# Patient Record
Sex: Female | Born: 1960 | Race: White | Hispanic: No | Marital: Married | Smoking: Never smoker
Health system: Southern US, Community
[De-identification: ages and names within clinical notes are randomized; demographics above are authoritative.]

## PROBLEM LIST (undated history)

## (undated) DIAGNOSIS — I10 Essential (primary) hypertension: Secondary | ICD-10-CM

## (undated) HISTORY — PX: COLONOSCOPY: SHX5424

## (undated) HISTORY — PX: HERNIA REPAIR: SHX51

## (undated) HISTORY — PX: APPENDECTOMY: SHX54

---
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
## 1984-06-29 HISTORY — PX: NASAL SEPTUM SURGERY: SHX37

## 2014-06-12 ENCOUNTER — Emergency Department: Payer: Self-pay | Admitting: Emergency Medicine

## 2014-06-12 LAB — CBC WITH DIFFERENTIAL/PLATELET
BASOS ABS: 0 10*3/uL (ref 0.0–0.1)
BASOS PCT: 0.3 %
Eosinophil #: 0.1 10*3/uL (ref 0.0–0.7)
Eosinophil %: 1.5 %
HCT: 45.2 % (ref 35.0–47.0)
HGB: 14.8 g/dL (ref 12.0–16.0)
Lymphocyte #: 2.2 10*3/uL (ref 1.0–3.6)
Lymphocyte %: 26.8 %
MCH: 30.8 pg (ref 26.0–34.0)
MCHC: 32.9 g/dL (ref 32.0–36.0)
MCV: 94 fL (ref 80–100)
Monocyte #: 0.6 x10 3/mm (ref 0.2–0.9)
Monocyte %: 7 %
Neutrophil #: 5.3 10*3/uL (ref 1.4–6.5)
Neutrophil %: 64.4 %
Platelet: 280 10*3/uL (ref 150–440)
RBC: 4.82 10*6/uL (ref 3.80–5.20)
RDW: 12.7 % (ref 11.5–14.5)
WBC: 8.2 10*3/uL (ref 3.6–11.0)

## 2014-06-12 LAB — URINALYSIS, COMPLETE
BILIRUBIN, UR: NEGATIVE
BLOOD: NEGATIVE
Bacteria: NONE SEEN
GLUCOSE, UR: NEGATIVE mg/dL (ref 0–75)
Ketone: NEGATIVE
LEUKOCYTE ESTERASE: NEGATIVE
Nitrite: NEGATIVE
PH: 6 (ref 4.5–8.0)
Protein: NEGATIVE
RBC,UR: <1 /HPF (ref 0–5)
Specific Gravity: 1.008 (ref 1.003–1.030)
Squamous Epithelial: 1

## 2014-06-12 LAB — COMPREHENSIVE METABOLIC PANEL
ALK PHOS: 86 U/L
Albumin: 4 g/dL (ref 3.4–5.0)
Anion Gap: 6 — ABNORMAL LOW (ref 7–16)
BUN: 7 mg/dL (ref 7–18)
Bilirubin,Total: 0.3 mg/dL (ref 0.2–1.0)
CREATININE: 0.75 mg/dL (ref 0.60–1.30)
Calcium, Total: 9.1 mg/dL (ref 8.5–10.1)
Chloride: 106 mmol/L (ref 98–107)
Co2: 25 mmol/L (ref 21–32)
EGFR (African American): >60
EGFR (Non-African Amer.): >60
GLUCOSE: 89 mg/dL (ref 65–99)
Osmolality: 271 (ref 275–301)
POTASSIUM: 3.8 mmol/L (ref 3.5–5.1)
SGOT(AST): 23 U/L (ref 15–37)
SGPT (ALT): 32 U/L
Sodium: 137 mmol/L (ref 136–145)
TOTAL PROTEIN: 7.8 g/dL (ref 6.4–8.2)

## 2014-08-01 ENCOUNTER — Ambulatory Visit: Payer: Self-pay | Admitting: Internal Medicine

## 2014-08-24 DIAGNOSIS — R1031 Right lower quadrant pain: Secondary | ICD-10-CM | POA: Insufficient documentation

## 2014-09-17 ENCOUNTER — Ambulatory Visit: Payer: Self-pay | Admitting: Gastroenterology

## 2014-09-24 ENCOUNTER — Ambulatory Visit: Payer: Self-pay | Admitting: Gastroenterology

## 2014-09-24 LAB — HCG, QUANTITATIVE, PREGNANCY: Beta Hcg, Quant.: <1 m[IU]/mL

## 2014-10-22 LAB — SURGICAL PATHOLOGY

## 2014-12-27 IMAGING — CT CT ABD-PELV W/ CM
2 of 5 series · 15 of 46 positions shown, 17 images · IV contrast (omnipaque)
Comparison: None.

CLINICAL DATA: Chronic lower abdominal pain, primarily on the
right.

EXAM:
CT ABDOMEN AND PELVIS WITH CONTRAST
TECHNIQUE: Multidetector CT imaging of the abdomen and pelvis was performed
using the standard protocol following bolus administration of
intravenous contrast. Oral contrast was also administered.
CONTRAST:  100 mL Omnipaque 300 nonionic

[Series 2: routine abd pel with · axial · 0.71mm/px · z∈[-331,+94]mm · 12 of 96 slices shown, 14 images]
[im 6/96  soft-tissue]
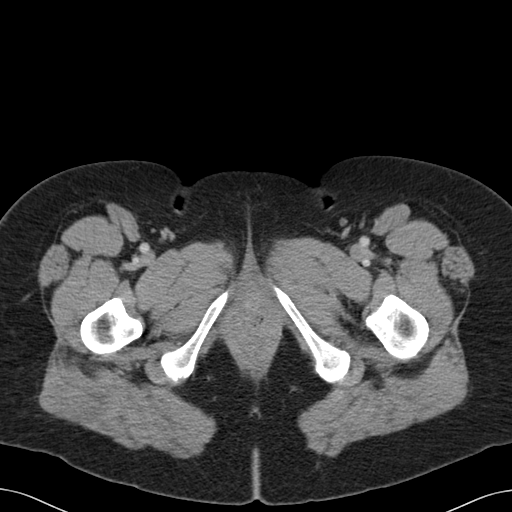
[im 6/96  bone]
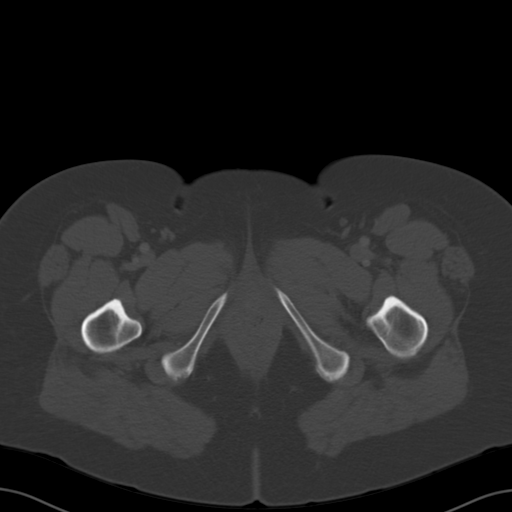
[im 16/96  soft-tissue]
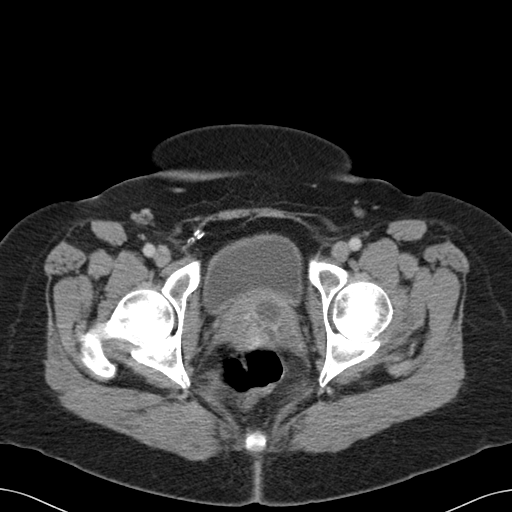
[im 21/96  soft-tissue]
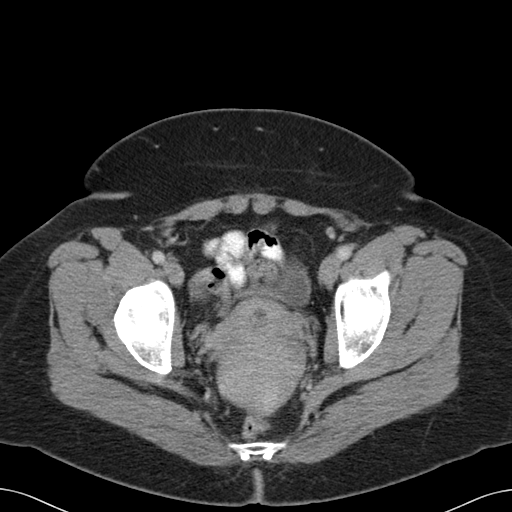
[im 31/96  soft-tissue]
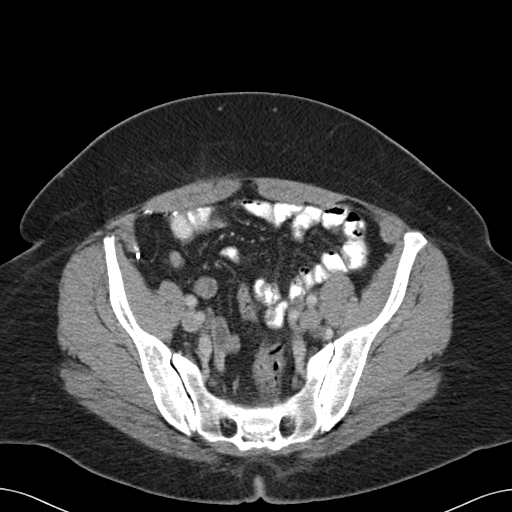
[im 36/96  soft-tissue]
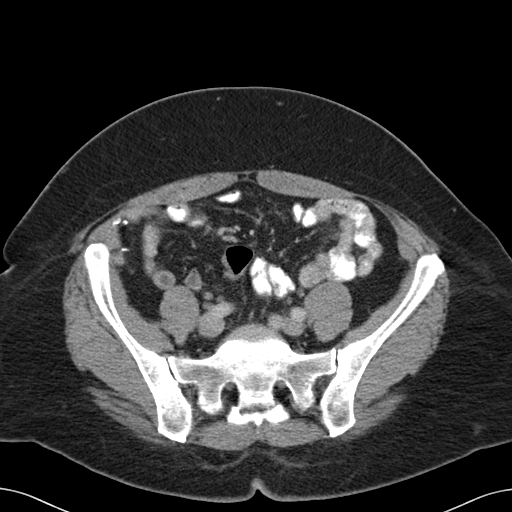
[im 46/96  soft-tissue]
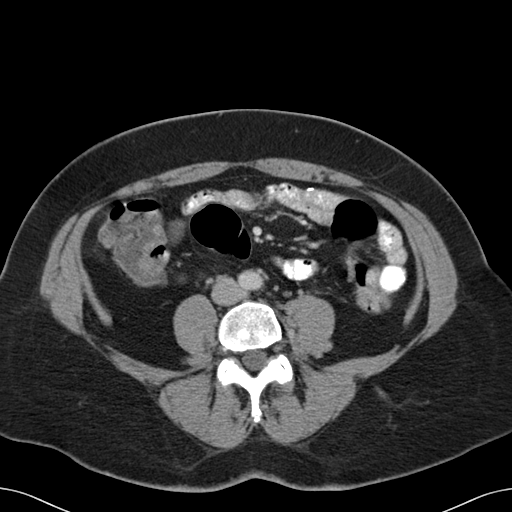
[im 51/96  soft-tissue]
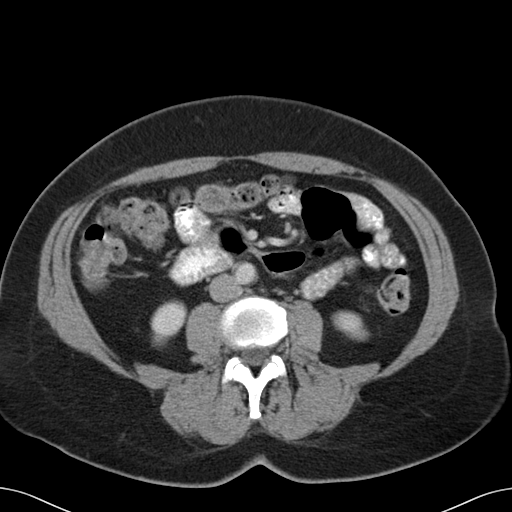
[im 61/96  soft-tissue]
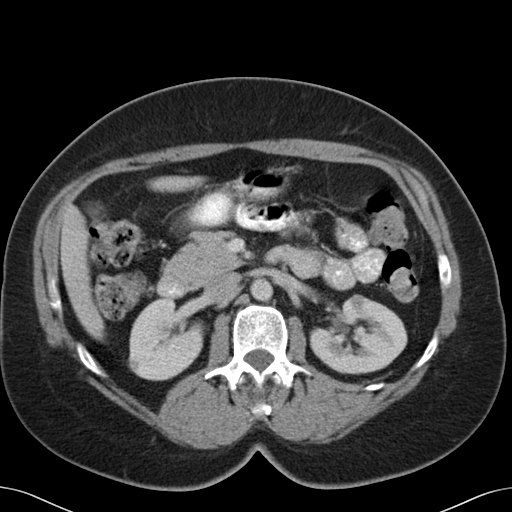
[im 66/96  soft-tissue]
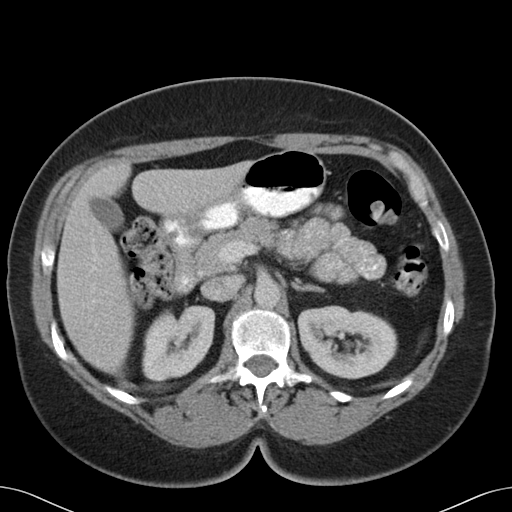
[im 66/96  bone]
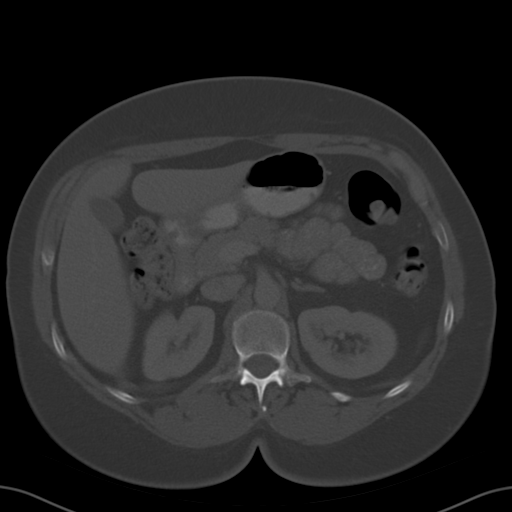
[im 76/96  soft-tissue]
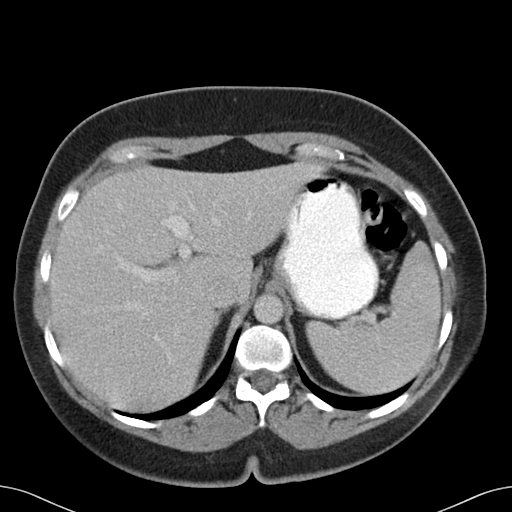
[im 81/96  soft-tissue]
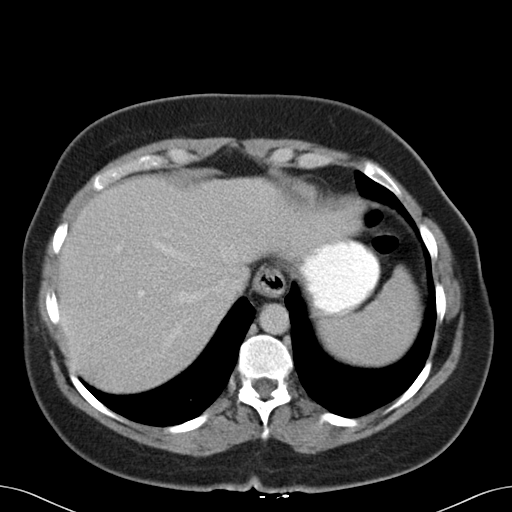
[im 91/96  soft-tissue]
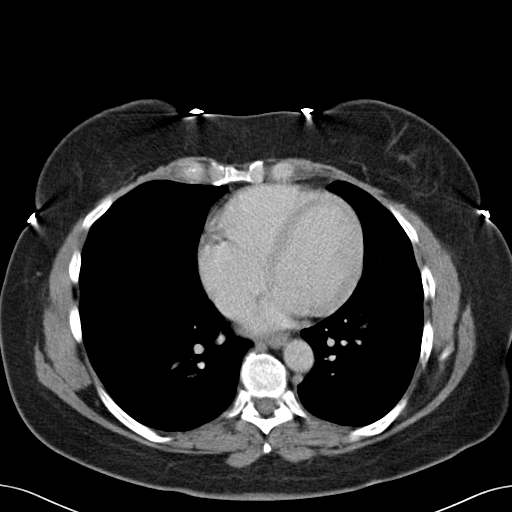

[Series 6: cor routine abd pel with · coronal · 0.69mm/px · 3 of 149 slices shown]
[im 50/149  soft-tissue]
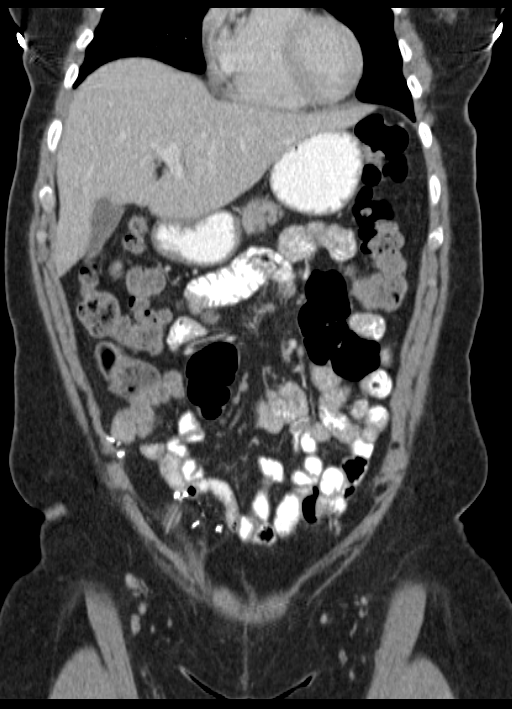
[im 66/149  soft-tissue]
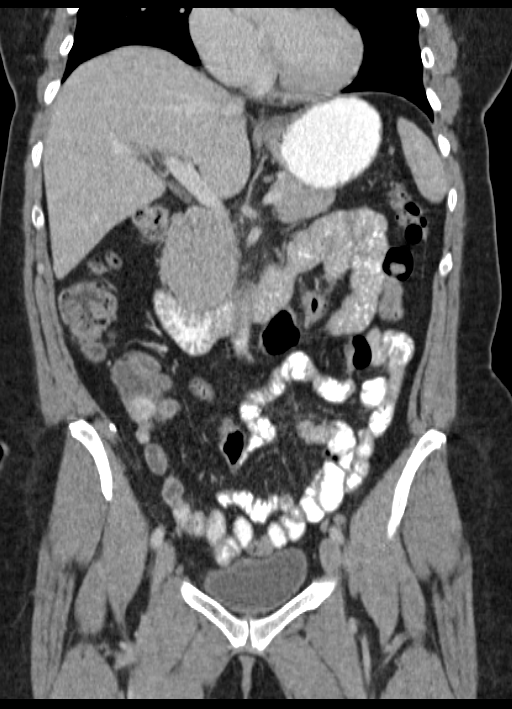
[im 83/149  soft-tissue]
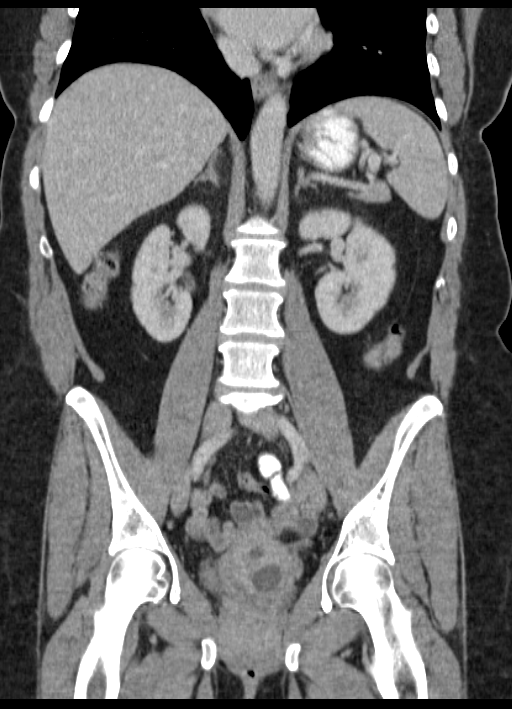

[15 of 46 positions shown; findings below may reference images not displayed]

FINDINGS: Lung bases are clear.  There is a small hiatal hernia.

No focal liver lesions are identified. The gallbladder wall is not
thickened. There is no biliary duct dilatation.

Spleen, pancreas, and adrenals appear normal.

There is a mass arising from the lateral right kidney measuring
by 1.0 cm which has attenuation values higher than is expected with
a simple cyst. There is a mass along the anterior mid left kidney
which also has attenuation values higher than is expected with a
simple cyst measuring 1.0 by 0.9 cm. No other renal masses are
appreciable. There is no hydronephrosis on either side. There is no
renal or ureteral calculus on either side.

In the pelvis, the urinary bladder is midline with normal wall
thickness. There is a prominent cervical nabothian cyst measuring
2.2 x 1.6 cm. A second nabothian cyst measures 1 x 1 cm. The
endometrium measures 2.0 cm in thickness which is rather prominent
for age. Apart from the uterus and cervix, no mass lesions are
identified. There is no pelvic fluid.

There are surgical clips in the lateral right lower abdomen.
Appendix is not seen. There is no periappendiceal region
inflammation.

There is fairly diffuse stool throughout the colon.

There is no bowel obstruction.  No free air or portal venous air.

There is no ascites, adenopathy, or abscess in the abdomen or
pelvis. There is no demonstrable abdominal aortic aneurysm. There is
degenerative change in the lumbar spine. There are no blastic or
lytic bone lesions.
IMPRESSION: Question a degree of constipation. No bowel obstruction. No abscess
or mesenteric thickening.

Prominence of the endometrium. This finding may warrant pelvic
ultrasound to further evaluate. There are cervical nabothian cysts.

There is a small mass lesion in each kidney which cannot be
classified as a cyst. Further evaluation nonemergently warranted.
Further evaluation with pre and post contrast MRI should be
considered. Pre and post contrast CT could alternatively be
performed, but would likely be of decreased accuracy given lesion
size.

Small hiatal hernia.

No renal or ureteral calculus. No hydronephrosis. No periappendiceal
region inflammation.

## 2015-03-14 ENCOUNTER — Other Ambulatory Visit: Payer: Self-pay | Admitting: Internal Medicine

## 2015-03-14 DIAGNOSIS — D259 Leiomyoma of uterus, unspecified: Secondary | ICD-10-CM

## 2015-03-18 ENCOUNTER — Ambulatory Visit: Payer: Commercial Indemnity

## 2015-05-17 ENCOUNTER — Ambulatory Visit
Admission: RE | Admit: 2015-05-17 | Discharge: 2015-05-17 | Disposition: A | Discharge status: Home / Self Care | Payer: Managed Care, Other (non HMO) | Source: Ambulatory Visit | Attending: Internal Medicine | Admitting: Internal Medicine

## 2015-05-17 DIAGNOSIS — D259 Leiomyoma of uterus, unspecified: Secondary | ICD-10-CM | POA: Insufficient documentation

## 2015-05-17 DIAGNOSIS — N83202 Unspecified ovarian cyst, left side: Secondary | ICD-10-CM | POA: Insufficient documentation

## 2015-06-09 ENCOUNTER — Emergency Department
Admission: EM | Admit: 2015-06-09 | Discharge: 2015-06-09 | Disposition: A | Discharge status: Home / Self Care | Payer: Managed Care, Other (non HMO) | Attending: Emergency Medicine | Admitting: Emergency Medicine

## 2015-06-09 ENCOUNTER — Encounter: Payer: Self-pay | Admitting: Emergency Medicine

## 2015-06-09 DIAGNOSIS — R1031 Right lower quadrant pain: Secondary | ICD-10-CM | POA: Insufficient documentation

## 2015-06-09 DIAGNOSIS — G8929 Other chronic pain: Secondary | ICD-10-CM | POA: Insufficient documentation

## 2015-06-09 DIAGNOSIS — I10 Essential (primary) hypertension: Secondary | ICD-10-CM | POA: Insufficient documentation

## 2015-06-09 HISTORY — DX: Essential (primary) hypertension: I10

## 2015-06-09 LAB — CBC WITH DIFFERENTIAL/PLATELET
Basophils Absolute: 0 10*3/uL (ref 0–0.1)
Basophils Relative: 1 %
EOS PCT: 0 %
Eosinophils Absolute: 0 10*3/uL (ref 0–0.7)
HEMATOCRIT: 40.6 % (ref 35.0–47.0)
Hemoglobin: 13.8 g/dL (ref 12.0–16.0)
LYMPHS ABS: 2.2 10*3/uL (ref 1.0–3.6)
LYMPHS PCT: 26 %
MCH: 30.8 pg (ref 26.0–34.0)
MCHC: 33.9 g/dL (ref 32.0–36.0)
MCV: 90.8 fL (ref 80.0–100.0)
MONO ABS: 0.6 10*3/uL (ref 0.2–0.9)
MONOS PCT: 7 %
NEUTROS ABS: 5.7 10*3/uL (ref 1.4–6.5)
Neutrophils Relative %: 66 %
PLATELETS: 268 10*3/uL (ref 150–440)
RBC: 4.47 MIL/uL (ref 3.80–5.20)
RDW: 13.1 % (ref 11.5–14.5)
WBC: 8.6 10*3/uL (ref 3.6–11.0)

## 2015-06-09 LAB — COMPREHENSIVE METABOLIC PANEL
ALT: 29 U/L (ref 14–54)
AST: 18 U/L (ref 15–41)
Albumin: 4.3 g/dL (ref 3.5–5.0)
Alkaline Phosphatase: 95 U/L (ref 38–126)
Anion gap: 7 (ref 5–15)
BILIRUBIN TOTAL: 0.5 mg/dL (ref 0.3–1.2)
BUN: 13 mg/dL (ref 6–20)
CHLORIDE: 107 mmol/L (ref 101–111)
CO2: 24 mmol/L (ref 22–32)
CREATININE: 0.61 mg/dL (ref 0.44–1.00)
Calcium: 9.3 mg/dL (ref 8.9–10.3)
Glucose, Bld: 103 mg/dL — ABNORMAL HIGH (ref 65–99)
POTASSIUM: 4.2 mmol/L (ref 3.5–5.1)
Sodium: 138 mmol/L (ref 135–145)
TOTAL PROTEIN: 7.9 g/dL (ref 6.5–8.1)

## 2015-06-09 LAB — LIPASE, BLOOD: Lipase: 33 U/L (ref 11–51)

## 2015-06-09 MED ORDER — OXYCODONE-ACETAMINOPHEN 5-325 MG PO TABS: 1.0000 | ORAL_TABLET | ORAL | Status: AC | PRN

## 2015-06-09 NOTE — ED Provider Notes (Signed)
[facility] Emergency Department Provider Note  ____________________________________________  Time seen: Approximately 3:33 PM  I have reviewed the triage vital signs and the nursing notes.   HISTORY  Chief Complaint Abdominal Pain    HPI Heidi Mcclain is a 54 y.o. female with a history of chronic right lower quadrant pain, status post remote right hernia mesh placement 3, uterine fibroids, status post appendectomy presenting with worsening right lower quadrant pain. Patient states that she has had daily right lower quadrant pain for years. She has been treated with gabapentin. In September October, she had a colonoscopy where 3 polyps were removed. Since then, she states that her pain has been getting progressively worse. She reports a "searing" pain that is not associated with any nausea, vomiting, diarrhea, urinary symptoms, or vaginal discharge. She is not sexually active. Last week, she had a pelvic ultrasound which showed uterine fibroids but normal ovaries; she has a follow-up appointment with her gynecologist this week. She is not been having any fevers or chills. She is requesting surgical follow-up "to go in there and see what is going on" as she does not have a general surgeon in the area.She is concerned that she may have a mesh that has been recalled. She also wanted to make sure that she is not "septic" today.   Past Medical History  Diagnosis Date  . Hypertension     There are no active problems to display for this patient.   Past Surgical History  Procedure Laterality Date  . Appendectomy    . Hernia repair      Current Outpatient Rx  Name  Route  Sig  Dispense  Refill  . oxyCODONE-acetaminophen (ROXICET) 5-325 MG tablet   Oral   Take 1 tablet by mouth every 4 (four) hours as needed.   20 tablet   0     Allergies Ciprofloxacin  No family history on file.  Social History Social History  Substance Use Topics  . Smoking status:  Never Smoker   . Smokeless tobacco: None  . Alcohol Use: Yes    Review of Systems Constitutional: No fever/chills. No lightheadedness or syncope. No trauma. Eyes: No visual changes. ENT: No sore throat. Cardiovascular: Denies chest pain, palpitations. Respiratory: Denies shortness of breath.  No cough. Gastrointestinal: Positive right lower quadrant abdominal pain. No flank pain. No nausea, no vomiting.  No diarrhea.  No constipation. No blood in the stool. Genitourinary: Negative for dysuria. No urinary frequency. No change in vaginal discharge. Musculoskeletal: Negative for back pain. Skin: Negative for rash. Neurological: Negative for headaches, focal weakness or numbness.  10-point ROS otherwise negative.  ____________________________________________   PHYSICAL EXAM:  VITAL SIGNS: ED Triage Vitals  Enc Vitals Group     BP 06/09/15 1453 149/79 mmHg     Pulse Rate 06/09/15 1453 68     Resp 06/09/15 1453 18     Temp 06/09/15 1453 97.9 F (36.6 C)     Temp Source 06/09/15 1453 Oral     SpO2 06/09/15 1453 99 %     Weight 06/09/15 1453 202 lb (91.627 kg)     Height 06/09/15 1453 5\' 7"  (1.702 m)     Head Cir --      Peak Flow --      Pain Score 06/09/15 1454 7     Pain Loc --      Pain Edu? --      Excl. in [place]? --     Constitutional: Alert  and oriented. Well appearing and in no acute distress. Answer question appropriately. Eyes: Conjunctivae are normal.  EOMI. Head: Atraumatic. Nose: No congestion/rhinnorhea. Mouth/Throat: Mucous membranes are moist.  Neck: No stridor.  Supple.   Cardiovascular: Normal rate, regular rhythm. No murmurs, rubs or gallops.  Respiratory: Normal respiratory effort.  No retractions. Lungs CTAB.  No wheezes, rales or ronchi. Gastrointestinal: Abdomen is overweight, soft, nondistended. She has a linear healed surgical scar in the right lower quadrant with pain laterally and distally to the surgical incision area. There is no palpable hernia.  No mass. No peritoneal signs, guarding or rebound. Patient has a negative Murphy sign. Musculoskeletal: No LE edema.  Neurologic:  Normal speech and language. No gross focal neurologic deficits are appreciated.  Skin:  Skin is warm, dry and intact. No rash noted. Psychiatric: Mood and affect are normal. Speech and behavior are normal.  Normal judgement  ____________________________________________   LABS (all labs ordered are listed, but only abnormal results are displayed)  Labs Reviewed  COMPREHENSIVE METABOLIC PANEL - Abnormal; Notable for the following:    Glucose, Bld 103 (*)    All other components within normal limits  CBC WITH DIFFERENTIAL/PLATELET  URINALYSIS COMPLETEWITH MICROSCOPIC ([facility] ONLY)  LIPASE, BLOOD   ____________________________________________  EKG  Not indicated ____________________________________________  RADIOLOGY  No results found.  ____________________________________________   PROCEDURES  Procedure(s) performed: No  Critical Care performed: No ____________________________________________   INITIAL IMPRESSION / ASSESSMENT AND PLAN / ED COURSE  Pertinent labs & imaging results that were available during my care of the patient were reviewed by me and considered in my medical decision making (see chart for details).  54 y.o. female with a history of chronic right lower abdominal pain with worsening pain over the last several weeks. The patient has no other systemic symptoms. Her vital signs are reassuring and her blood work is normal. She is not having any dysuria or vaginal discharge. I think it is likely that she is continuing to have chronic pain. I do not see any evidence of incarcerated hernia, or other acute surgical pathology today. I will give her the referral to the general surgeon on call, and have prescribed narcotics for short-term use. I think she will benefit from keeping her gynecologic appointment, seeing the general surgeons,  and if the workup is negative from there, consider making a long-term pain management plan with her from a care physician, Dr. Chancy Milroy. Patient be discharge and understands return precautions as well as follow-up instructions.  ____________________________________________  FINAL CLINICAL IMPRESSION(S) / ED DIAGNOSES  Final diagnoses:  Chronic right lower quadrant pain      NEW MEDICATIONS STARTED DURING THIS VISIT:  New Prescriptions   OXYCODONE-ACETAMINOPHEN (ROXICET) 5-325 MG TABLET    Take 1 tablet by mouth every 4 (four) hours as needed.     Eula Listen, MD 06/09/15 1539

## 2015-06-09 NOTE — Discharge Instructions (Signed)
Please keep your appointment with the gynecologist this week. Please make an appointment with the general surgeon for evaluation. Please bring all your previous surgical records with you to your appointments.  Please return to the emergency department if you develop severe pain, fever, inability to keep down fluids, or any other symptoms concerning to you.

## 2015-06-09 NOTE — ED Notes (Signed)
C/o RLQ abd. That she has had since her colonoscopy in September, states she has a "hernia patch" in the same area that she has pain, states pain has gotten progressively worse, denies any n,v

## 2015-06-13 ENCOUNTER — Other Ambulatory Visit: Payer: Self-pay

## 2015-06-19 ENCOUNTER — Ambulatory Visit: Payer: Self-pay | Admitting: General Surgery

## 2015-06-20 ENCOUNTER — Ambulatory Visit (INDEPENDENT_AMBULATORY_CARE_PROVIDER_SITE_OTHER): Payer: Managed Care, Other (non HMO) | Admitting: General Surgery

## 2015-06-20 ENCOUNTER — Encounter: Payer: Self-pay | Admitting: General Surgery

## 2015-06-20 VITALS — BP 130/80 | HR 76 | Resp 12 | Ht 67.0 in | Wt 211.0 lb

## 2015-06-20 DIAGNOSIS — R1030 Lower abdominal pain, unspecified: Secondary | ICD-10-CM

## 2015-06-20 DIAGNOSIS — R1031 Right lower quadrant pain: Secondary | ICD-10-CM

## 2015-06-20 NOTE — Progress Notes (Addendum)
Patient ID: Heidi Mcclain, female   DOB: February 01, 1961, 54 y.o.   MRN: [id number]  Chief Complaint  Patient presents with  . Other    right groin pain    HPI Heidi HEMBERGER is a 54 y.o. female here today for a evaluation of right groin pain. She states pain in the area has been going on for 14 years now. In the last year she states the area is burning more and painful to do particular activities area walking is not troublesome, nor is hiking but using a stair master produces pain. At times she finds it difficult to lay flat in bed at night. The patient reports a popping sensation in the right groin that is intermittent and unrelated to activity. The patient first began to have diffuse abdominal pain in 1998-1999. She described undergoing a diagnostic laparoscopy and then what sounds to be a laparotomy. She subsequently underwent laparoscopic hernia repair. The pain had settled into the right groin by 2000-2001 at the time of the hernia repair. She was more aware of a reproducible mass in the groin prior to the hernia repair. She's had pain in the groin since the procedure, although fairly minimal until year ago.   No records of her treatment in [place], was constant were available for review.   I personally reviewed the patient's history. HPI  Past Medical History  Diagnosis Date  . Hypertension     Past Surgical History  Procedure Laterality Date  . Tonsillectomy  1965  . Colonoscopy  [id number]    Ronnette Hila, M  . Nasal septum surgery  1986  . Hernia repair      right inguinal  . Hernia repair      umbilical   . Appendectomy      age 24    Family History  Problem Relation Age of Onset  . Breast cancer Paternal Grandmother     Social History Social History  Substance Use Topics  . Smoking status: Never Smoker   . Smokeless tobacco: None  . Alcohol Use: Yes    Allergies  Allergen Reactions  . Ciprofloxacin Hives    Current Outpatient Prescriptions  Medication  Sig Dispense Refill  . amLODipine-benazepril (LOTREL) 5-10 MG capsule Take 1 capsule by mouth daily.  1  . gabapentin (NEURONTIN) 100 MG capsule Take 2 capsules by mouth 2 (two) times daily.  2  . ketoconazole (NIZORAL) 2 % shampoo Apply 1 application topically daily.  4  . omeprazole (PRILOSEC) 40 MG capsule Take 1 capsule by mouth daily.  1  . oxyCODONE-acetaminophen (ROXICET) 5-325 MG tablet Take 1 tablet by mouth every 4 (four) hours as needed. 20 tablet 0  . PARoxetine (PAXIL) 10 MG tablet Take 1 tablet by mouth daily.  6   No current facility-administered medications for this visit.    Review of Systems Review of Systems  Constitutional: Negative.   HENT: Negative.   Respiratory: Negative.   Cardiovascular: Negative.   Endocrine: Negative.   Genitourinary: Negative.   Allergic/Immunologic: Negative.   Hematological: Negative.   Psychiatric/Behavioral: Negative.     Blood pressure 130/80, pulse 76, resp. rate 12, height 5\' 7"  (1.702 m), weight 211 lb (95.709 kg), last menstrual period 05/12/2015.  Physical Exam Physical Exam  Constitutional: She is oriented to person, place, and time. She appears well-developed and well-nourished.  HENT:  Head: Normocephalic.  Eyes: Conjunctivae are normal. Pupils are equal, round, and reactive to light. No scleral icterus.  Neck: Neck supple. No  thyromegaly present.  Cardiovascular: Normal rate, regular rhythm and normal heart sounds.   Pulmonary/Chest: Effort normal and breath sounds normal.  Abdominal: Soft. Normal appearance and bowel sounds are normal. There is no hepatomegaly. There is no tenderness.    Musculoskeletal: Normal range of motion.  Lymphadenopathy:    She has no cervical adenopathy.  Neurological: She is alert and oriented to person, place, and time.  Skin: Skin is warm and dry.  Psychiatric: She has a normal mood and affect. Her behavior is normal. Judgment and thought content normal.    Data Reviewed Office  notes from Bunnie Pion, MD dated 06/13/2015 were reviewed. No GYN source identified.  CT scan of the abdomen and pelvis completed on 06/12/2014 through the emergency department for right lower quadrant pain showed questionable renal masses that were less than 1.2 cm in diameter. A vertical cyst was identified measuring 2.2 cm. Thickened endometrium a 2.0 cm. No mass lesions are identified. Surgical clips are identified in the right lateral lower abdomen. Appendix appears absent. No evidence of inflammation in the right lower quadrant. Questionable degree of constipation. Small bowel follow-through for right lower quadrant pain ordered by Loistine Simas, M.D. 09/24/2014 was normal.  Colonoscopy dated 09/17/2014 for right lower quadrant pain showed polyps in the cecum and sigmoid colon. Pathology showed a sessile serrated adenoma without dysplasia. Random biopsies of the right colon were negative as well as left colon. Hyperplastic polyps in the sigmoid.  Impression  Intermittent right lower quadrant pain, likely musculoskeletal source. Not clearly related to previous transfixion device is used at the time of the 2001 hernia repair.   Plan    The patient brought up the possibility of removing the tacks and placing a new mesh. Would not for the tacks being evident there is no inflammation in the right lower quadrant to suggest an inflammatory process involving the mesh. The possibility of removing all of the tacks is low, and the likelihood of this leaving her pain is small.  She had resumed gabapentin recently as this had been beneficial year ago. She's been making use of it for several weeks now without any improvement.  The patient may benefit from a management assessment for nerve block/TENS unit/biofeedback. No indication for surgical intervention at this time. The vast majority of the 45 minute appointment was spent reviewing records and CT scans other imaging studies and reviewing options  for management.  I will be glad to review the records from [place] if they're made available.      PCP:  Perrin Maltese  Ref. Dr. Vernie Ammons  This information has been scribed by Gaspar Cola CMA.    Robert Bellow 06/21/2015, 11:40 AM

## 2015-06-20 NOTE — Patient Instructions (Signed)
Patient to return as needed. 

## 2015-06-21 DIAGNOSIS — R1031 Right lower quadrant pain: Secondary | ICD-10-CM | POA: Insufficient documentation

## 2015-10-28 ENCOUNTER — Other Ambulatory Visit: Payer: Self-pay | Admitting: Internal Medicine

## 2015-10-28 DIAGNOSIS — Z1231 Encounter for screening mammogram for malignant neoplasm of breast: Secondary | ICD-10-CM

## 2015-11-26 ENCOUNTER — Ambulatory Visit: Payer: Managed Care, Other (non HMO)

## 2015-12-05 ENCOUNTER — Ambulatory Visit: Payer: Managed Care, Other (non HMO)

## 2015-12-17 ENCOUNTER — Other Ambulatory Visit: Payer: Self-pay | Admitting: Internal Medicine

## 2015-12-17 DIAGNOSIS — R9389 Abnormal findings on diagnostic imaging of other specified body structures: Secondary | ICD-10-CM

## 2015-12-19 ENCOUNTER — Other Ambulatory Visit: Payer: Self-pay | Admitting: Internal Medicine

## 2015-12-19 ENCOUNTER — Ambulatory Visit
Admission: RE | Admit: 2015-12-19 | Discharge: 2015-12-19 | Disposition: A | Discharge status: Home / Self Care | Payer: Managed Care, Other (non HMO) | Source: Ambulatory Visit | Attending: Internal Medicine | Admitting: Internal Medicine

## 2015-12-19 DIAGNOSIS — Z1231 Encounter for screening mammogram for malignant neoplasm of breast: Secondary | ICD-10-CM | POA: Insufficient documentation

## 2015-12-19 DIAGNOSIS — M25511 Pain in right shoulder: Secondary | ICD-10-CM

## 2015-12-26 ENCOUNTER — Ambulatory Visit
Admission: RE | Admit: 2015-12-26 | Discharge: 2015-12-26 | Disposition: A | Discharge status: Home / Self Care | Payer: Managed Care, Other (non HMO) | Source: Ambulatory Visit | Attending: Internal Medicine | Admitting: Internal Medicine

## 2015-12-26 DIAGNOSIS — N83201 Unspecified ovarian cyst, right side: Secondary | ICD-10-CM | POA: Insufficient documentation

## 2015-12-26 DIAGNOSIS — N888 Other specified noninflammatory disorders of cervix uteri: Secondary | ICD-10-CM | POA: Insufficient documentation

## 2015-12-26 DIAGNOSIS — R9389 Abnormal findings on diagnostic imaging of other specified body structures: Secondary | ICD-10-CM

## 2016-01-06 ENCOUNTER — Ambulatory Visit
Admission: RE | Admit: 2016-01-06 | Discharge: 2016-01-06 | Disposition: A | Discharge status: Home / Self Care | Payer: Managed Care, Other (non HMO) | Source: Ambulatory Visit | Attending: Internal Medicine | Admitting: Internal Medicine

## 2016-01-06 ENCOUNTER — Other Ambulatory Visit: Payer: Self-pay | Admitting: Internal Medicine

## 2016-01-06 DIAGNOSIS — N6001 Solitary cyst of right breast: Secondary | ICD-10-CM | POA: Insufficient documentation

## 2016-01-06 DIAGNOSIS — N6310 Unspecified lump in the right breast, unspecified quadrant: Secondary | ICD-10-CM

## 2016-01-06 DIAGNOSIS — N63 Unspecified lump in breast: Secondary | ICD-10-CM | POA: Diagnosis present

## 2016-01-08 ENCOUNTER — Ambulatory Visit: Payer: Managed Care, Other (non HMO)

## 2016-01-14 ENCOUNTER — Ambulatory Visit: Admission: RE | Admit: 2016-01-14 | Payer: Managed Care, Other (non HMO) | Source: Ambulatory Visit

## 2016-01-14 ENCOUNTER — Other Ambulatory Visit: Payer: Managed Care, Other (non HMO)

## 2017-01-08 IMAGING — US US PELVIS COMPLETE
1 series · 14 of 25 positions shown · non-contrast
Comparison: CT abdomen and pelvis 06/12/2014.

CLINICAL DATA: Right lower quadrant pain. Vaginal bleeding since
February 2015.



[Series 1: us pelvis complete · 0.31mm/px · 14 of 112 slices shown]
[im 1/112]
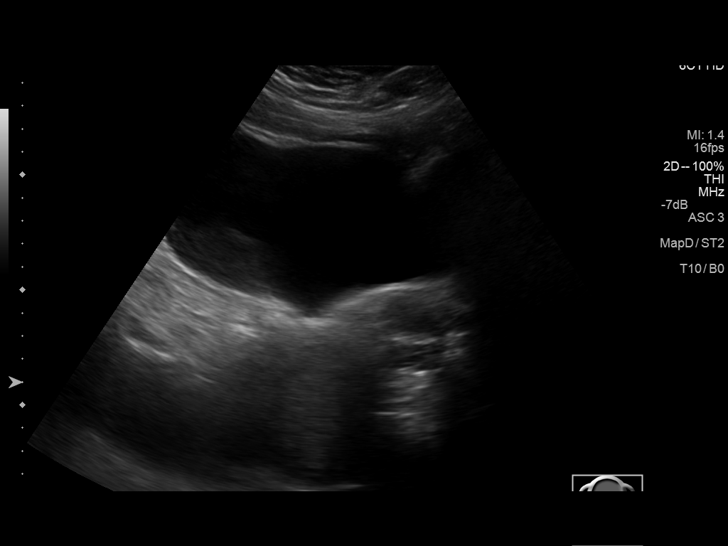
[im 10/112]
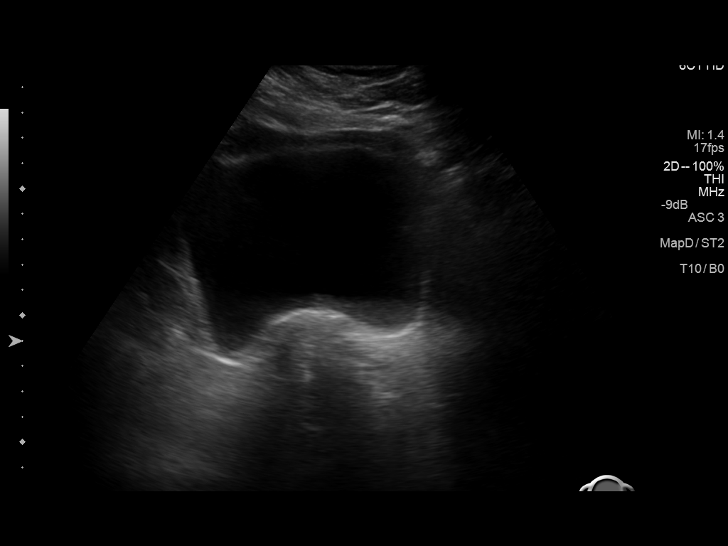
[im 19/112]
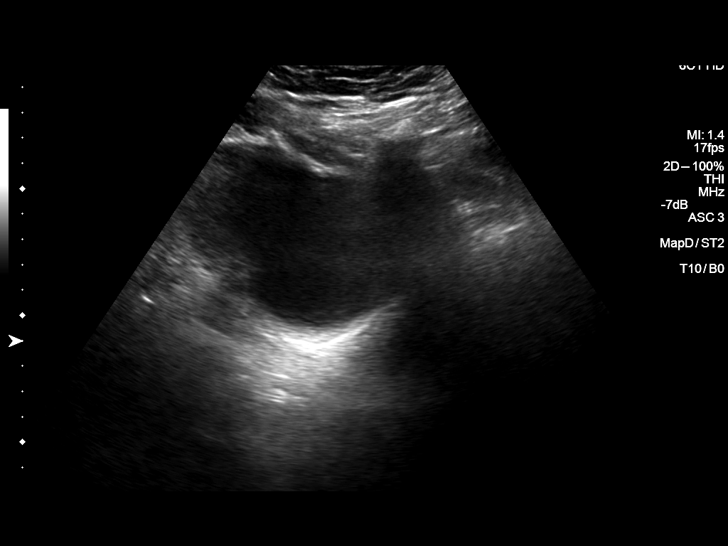
[im 28/112]
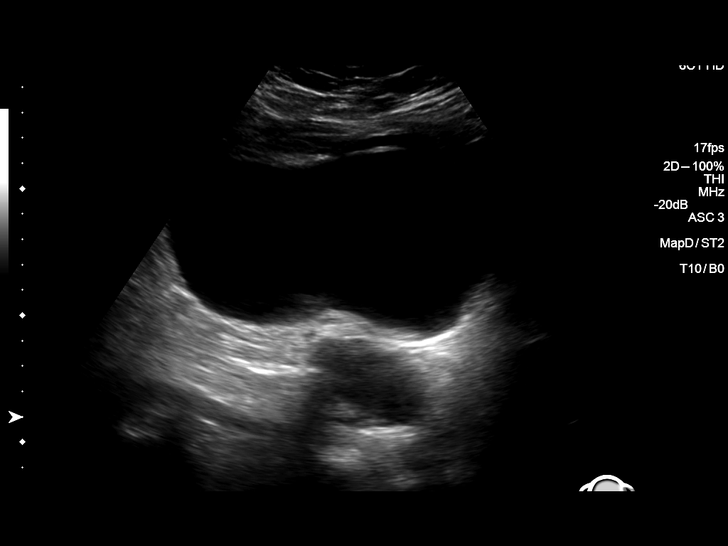
[im 38/112]
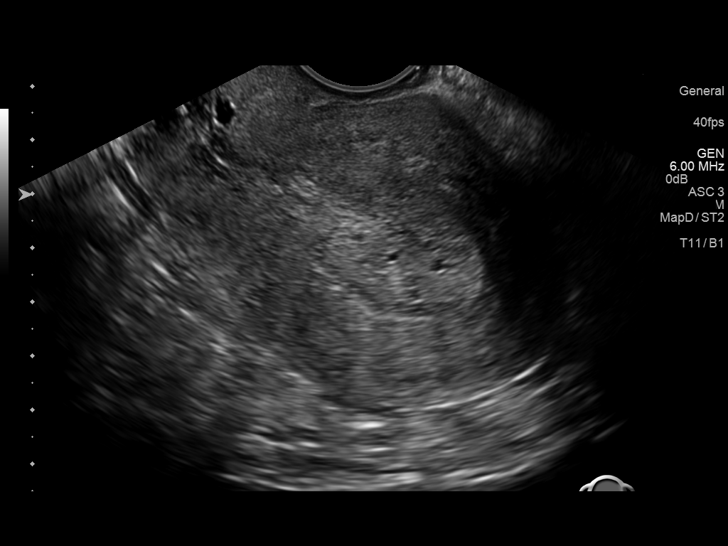
[im 42/112]
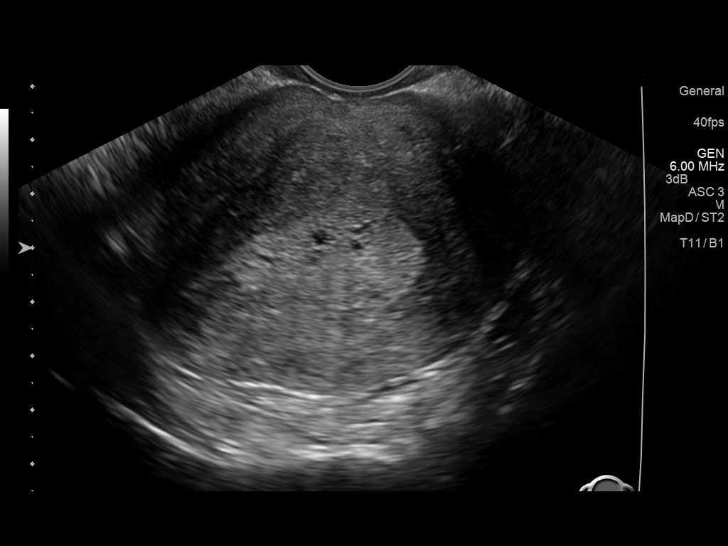
[im 51/112]
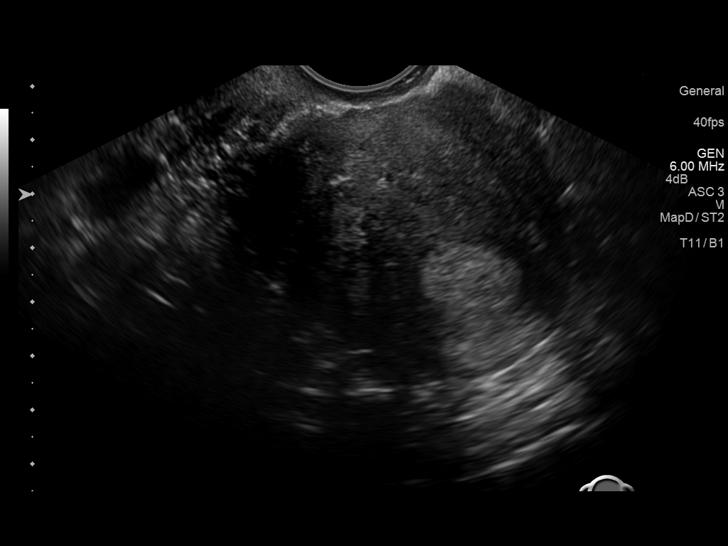
[im 61/112]
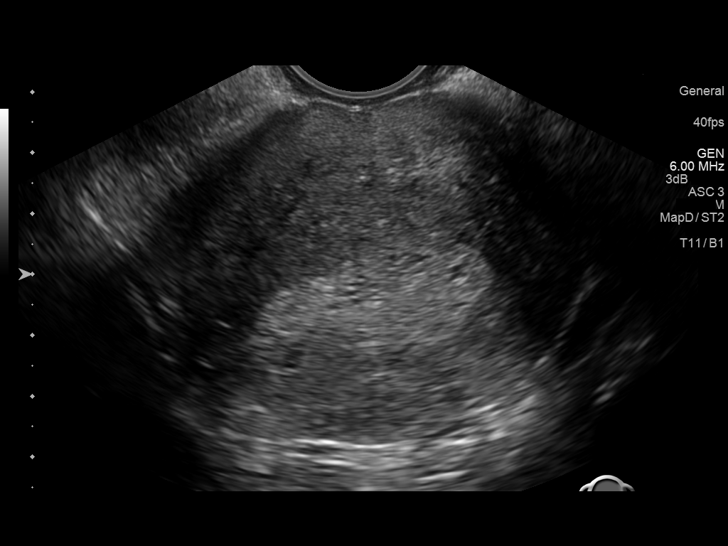
[im 70/112]
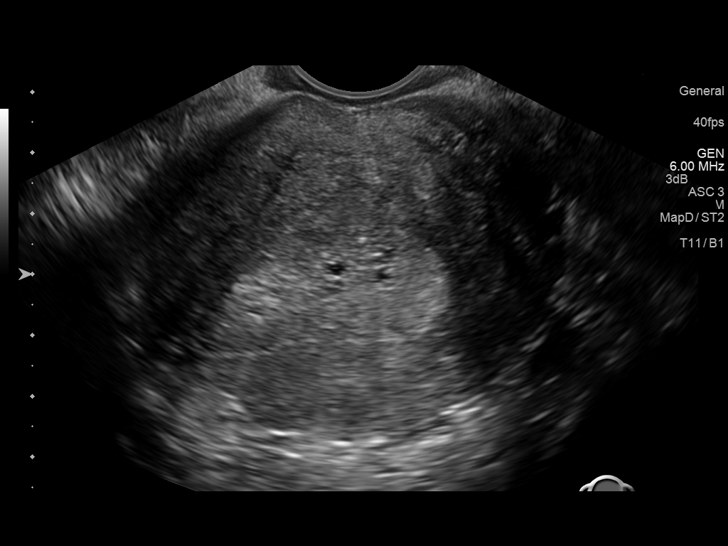
[im 75/112]
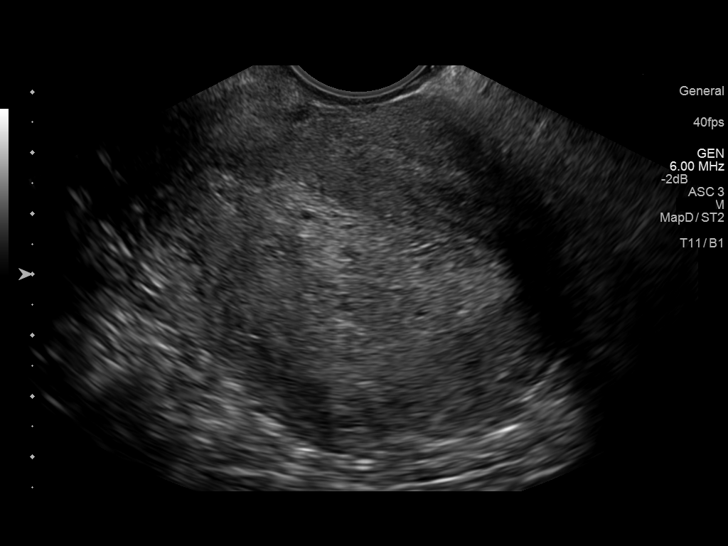
[im 84/112]
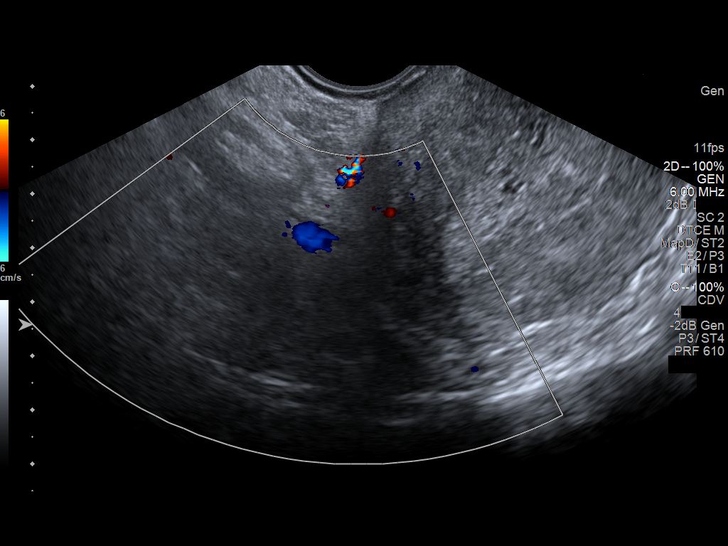
[im 93/112]
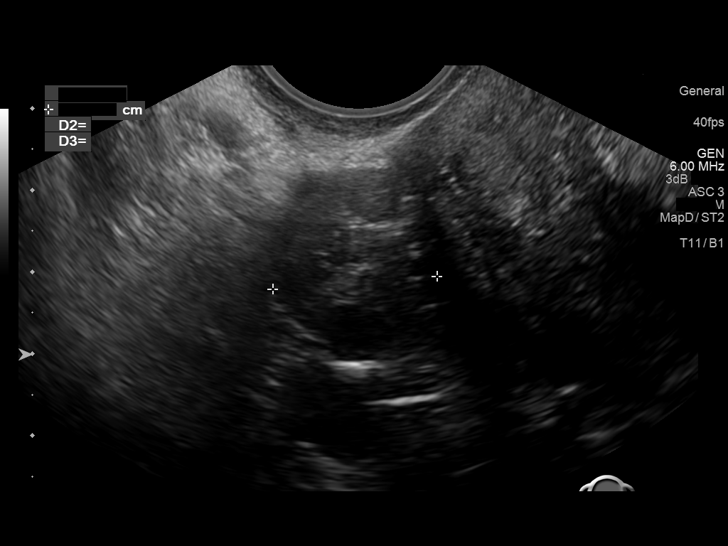
[im 102/112]
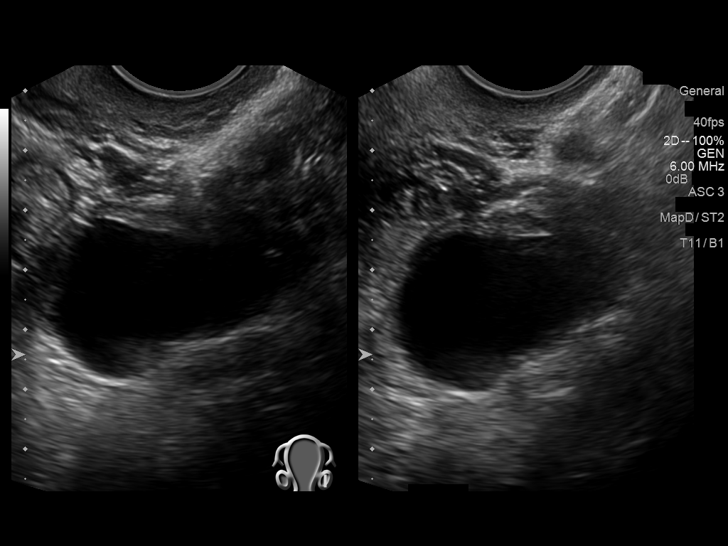
[im 112/112]
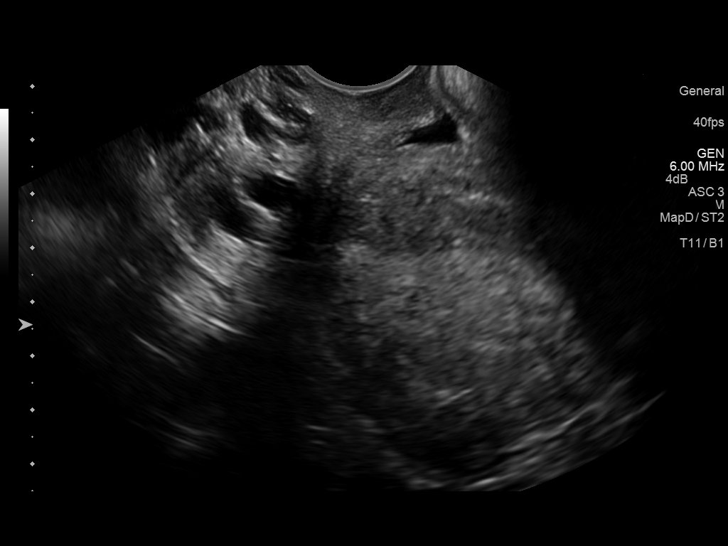

[14 of 25 positions shown; findings below may reference images not displayed]

FINDINGS: Uterus

Measurements: 8.2 x 5.9 x 6.8 cm. A single fibroid is seen
posteriorly measuring 2.1 x 1.4 x 2.2 cm

Endometrium

Thickness: 2.2 cm. No focal abnormality visualized. Flow is sitting
within the endometrium on Doppler imaging.

Right ovary

Measurements: 2.1 x 1.5 x 2.0 cm. Normal appearance/no adnexal mass.

Left ovary

Measurements: 4.8 x 3.2 x 3.6 cm. A simple cyst measuring 3.6 x
x 4.8 cm is identified.

Other findings

No free fluid.
IMPRESSION: Abnormally thickened endometrium at 2.2 cm. Consider endometrial
biopsy for further evaluation.

Simple left ovarian cyst.

Single uterine fibroid measures 2.2 cm

## 2017-08-19 IMAGING — US US TRANSVAGINAL NON-OB
1 series · 13 of 25 positions shown · non-contrast
Comparison: Pelvic ultrasound May 17, 2015

CLINICAL DATA: History of the thickened endometrium with biopsy
performed in 0385 ; the patient's most recent menstrual period was
in Tuesday June, 2015. The patient reports being perimenopausal.



[Series 1: us transvaginal non-ob · 0.28mm/px · 13 of 79 slices shown]
[im 1/79]
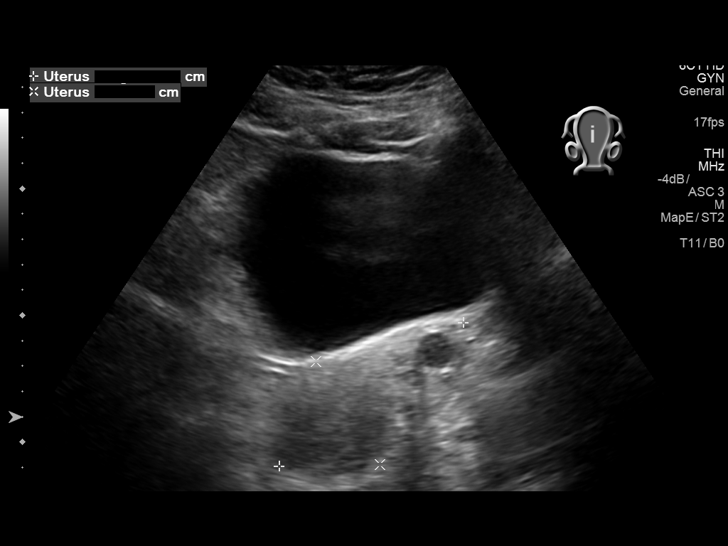
[im 7/79]
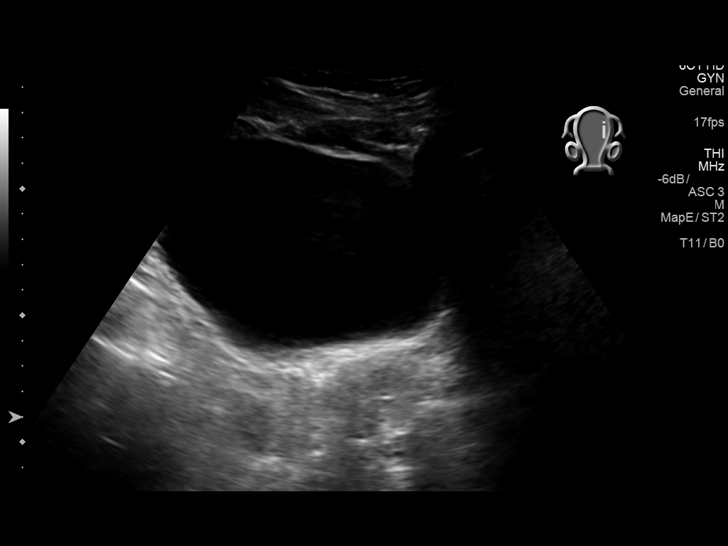
[im 14/79]
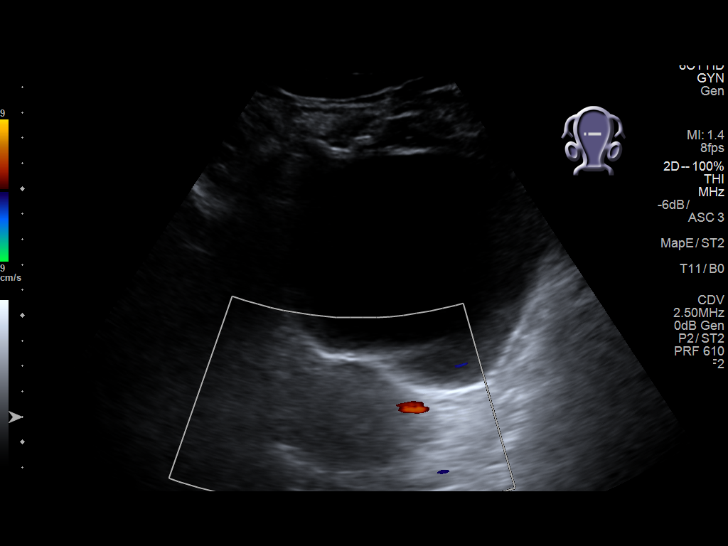
[im 20/79]
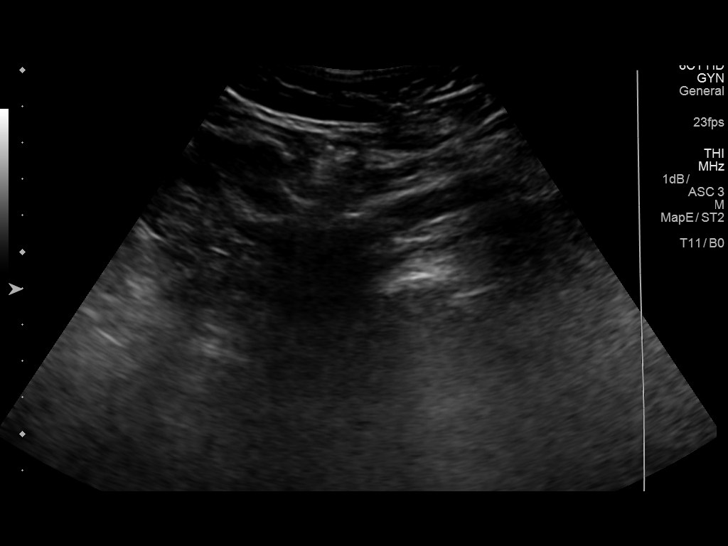
[im 27/79]
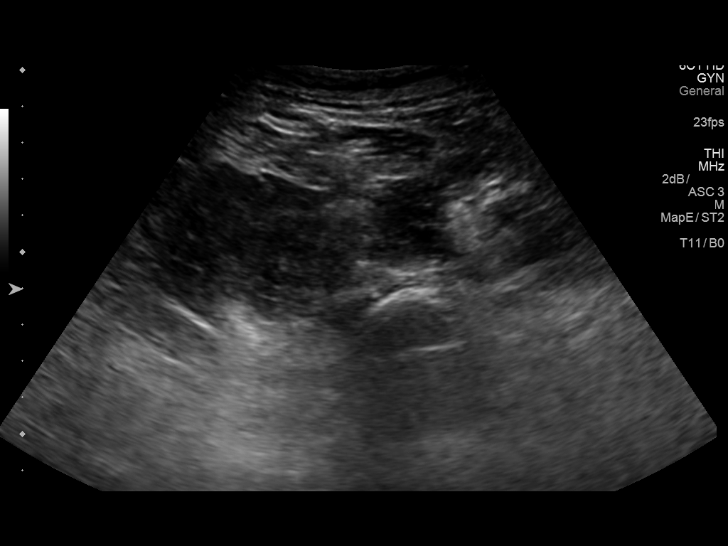
[im 33/79]
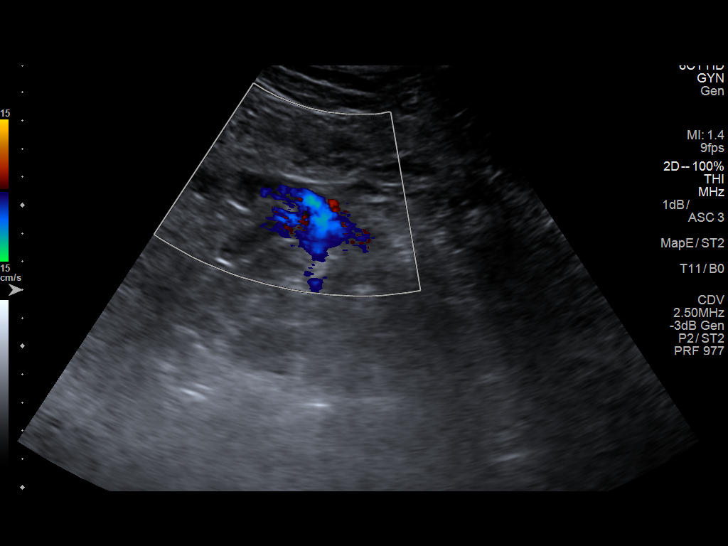
[im 40/79]
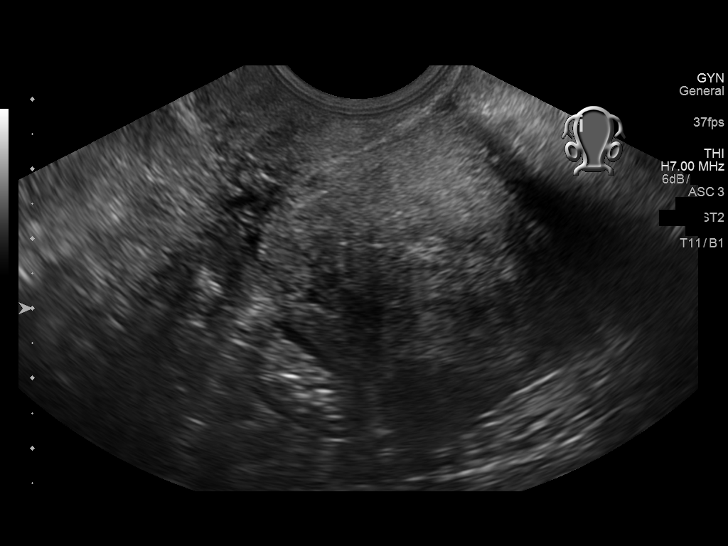
[im 46/79]
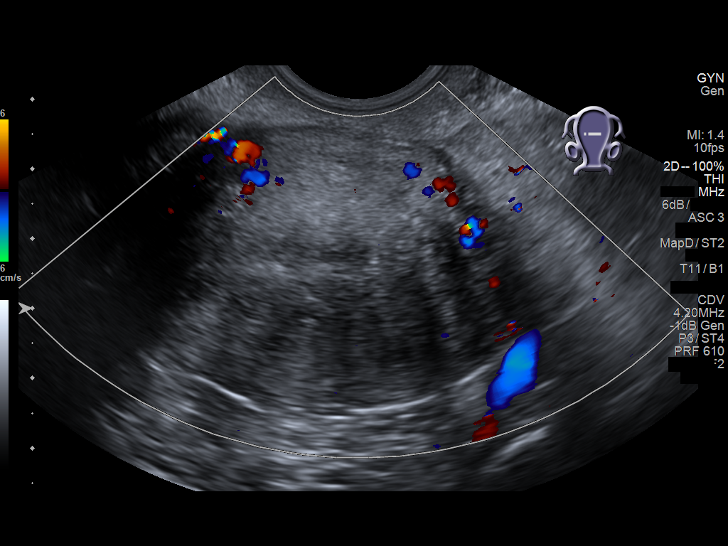
[im 53/79]
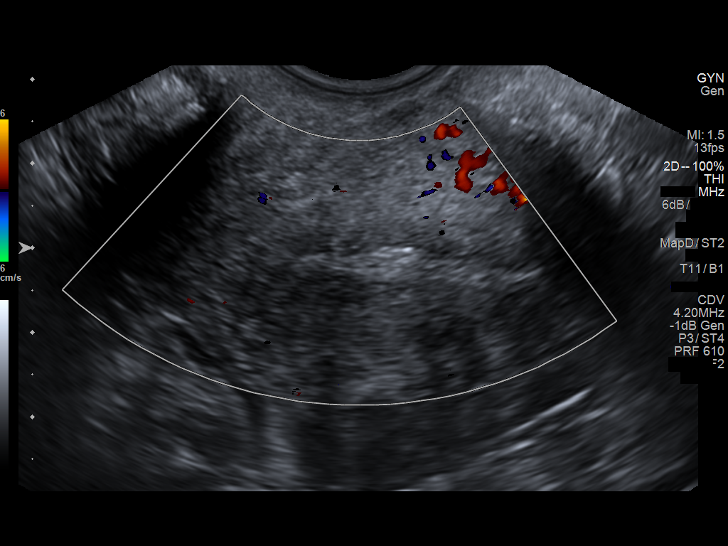
[im 59/79]
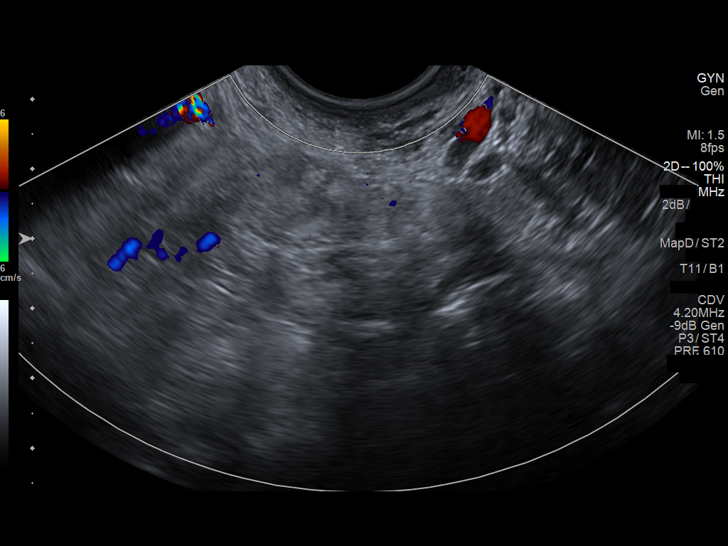
[im 66/79]
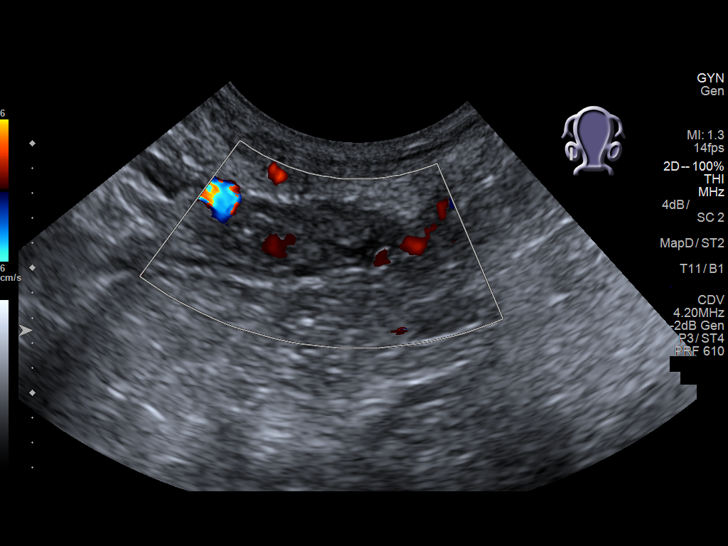
[im 72/79]
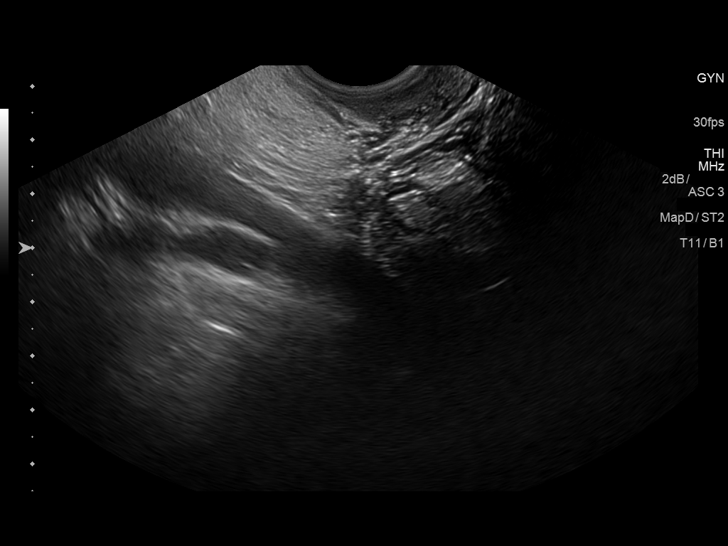
[im 79/79]
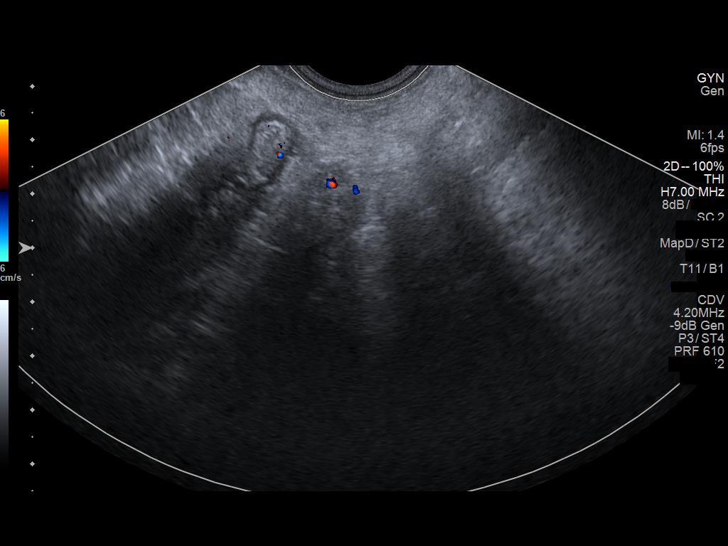

[13 of 25 positions shown; findings below may reference images not displayed]

FINDINGS: Uterus

Measurements: 7.7 x 4.1 x 5.7 cm. No fibroids or other mass
visualized. There is a nabothian cyst measuring 1.8 x 1.7 x 1.8 cm.

Endometrium

Thickness: 5 mm.  No focal abnormality visualized.

Right ovary

Measurements: 1.3 x 0.8 x 1.7 cm. There is a right ovarian cystic
structure measuring 1 cm in greatest dimension.

Left ovary

The left ovary could not be visualized.

Other findings

No abnormal free fluid.
IMPRESSION: 1. Normal appearance of the uterus. There is a nabothian cyst
measuring up to 1.8 cm. The endometrial stripe measures 5 mm. No
endometrial masses are observed.
2. 1 cm diameter simple appearing right ovarian cyst.
3. Nonvisualization of the left ovary.

## 2017-08-30 IMAGING — US US BREAST*R* LIMITED INC AXILLA
1 series · 7 of 7 positions shown · non-contrast
Comparison: Previous exam(s).

CLINICAL DATA: 55-year-old female for evaluation of possible right
breast mass on screening mammogram.

EXAM:
2D DIGITAL DIAGNOSTIC RIGHT MAMMOGRAM WITH ADJUNCT TOMO
ULTRASOUND RIGHT BREAST

[Series 1: us breast*right* limited inc axilla · 0.09mm/px · 7 of 7 slices shown]
[im 1/7]
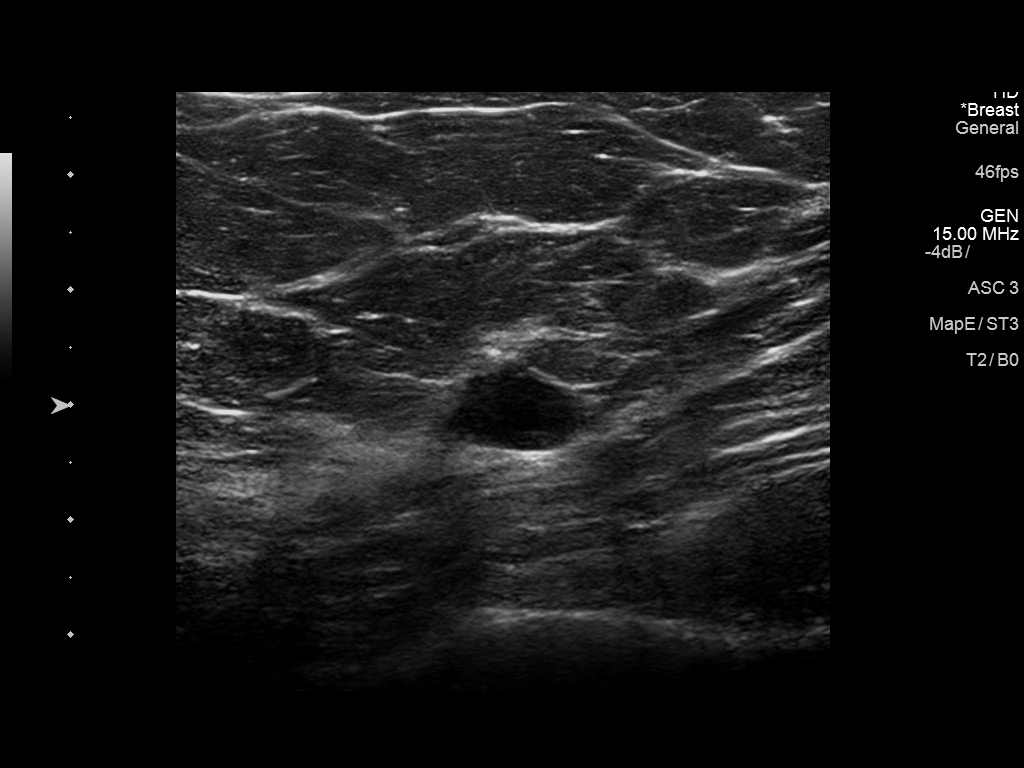
[im 2/7]
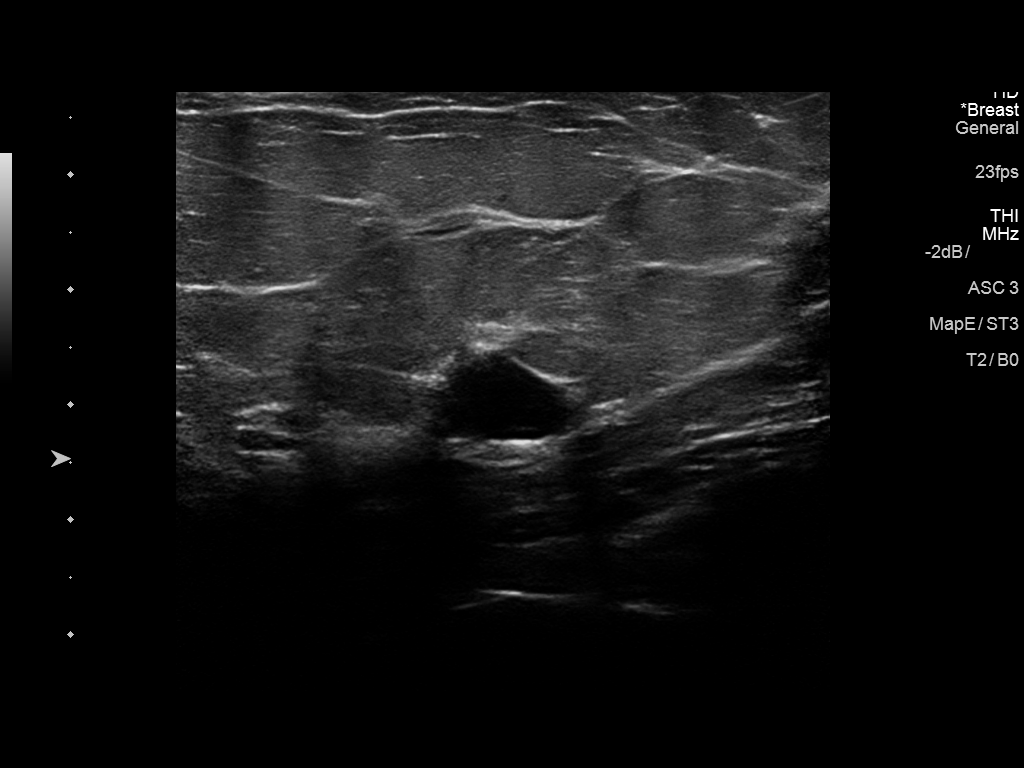
[im 3/7]
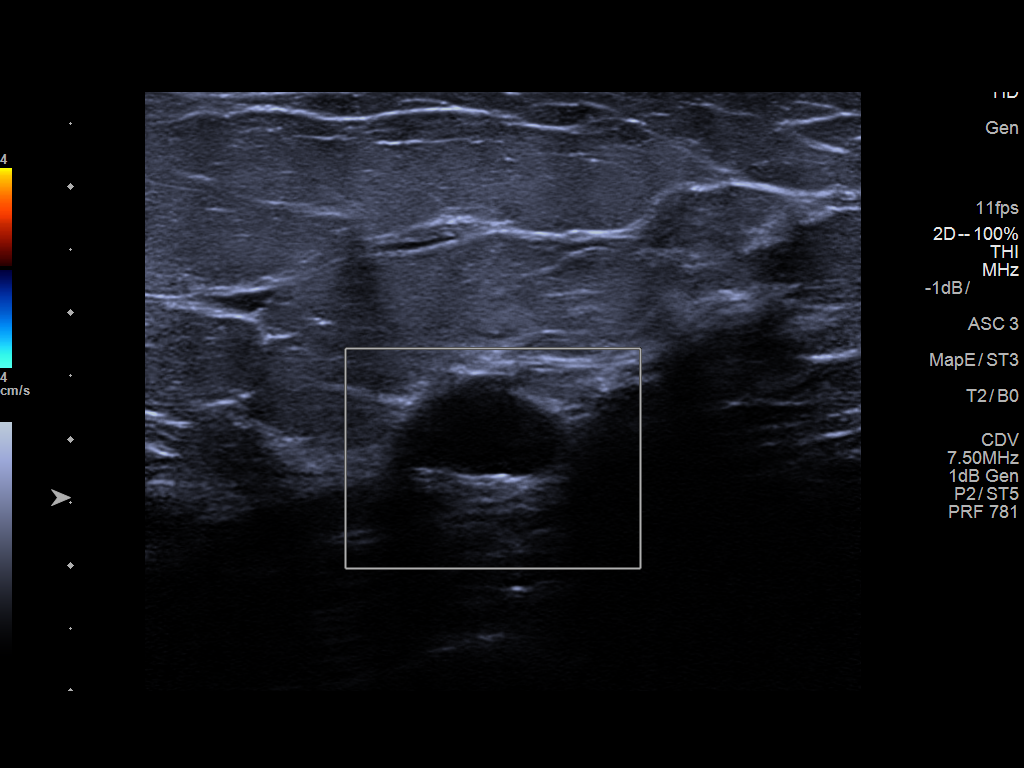
[im 4/7]
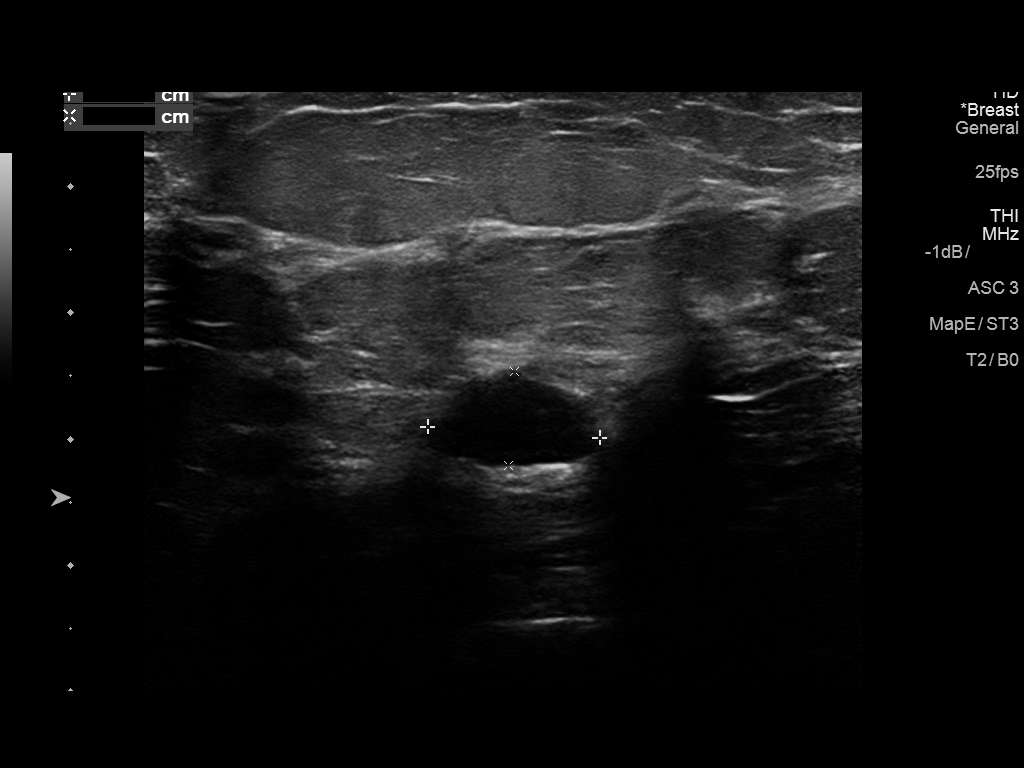
[im 5/7]
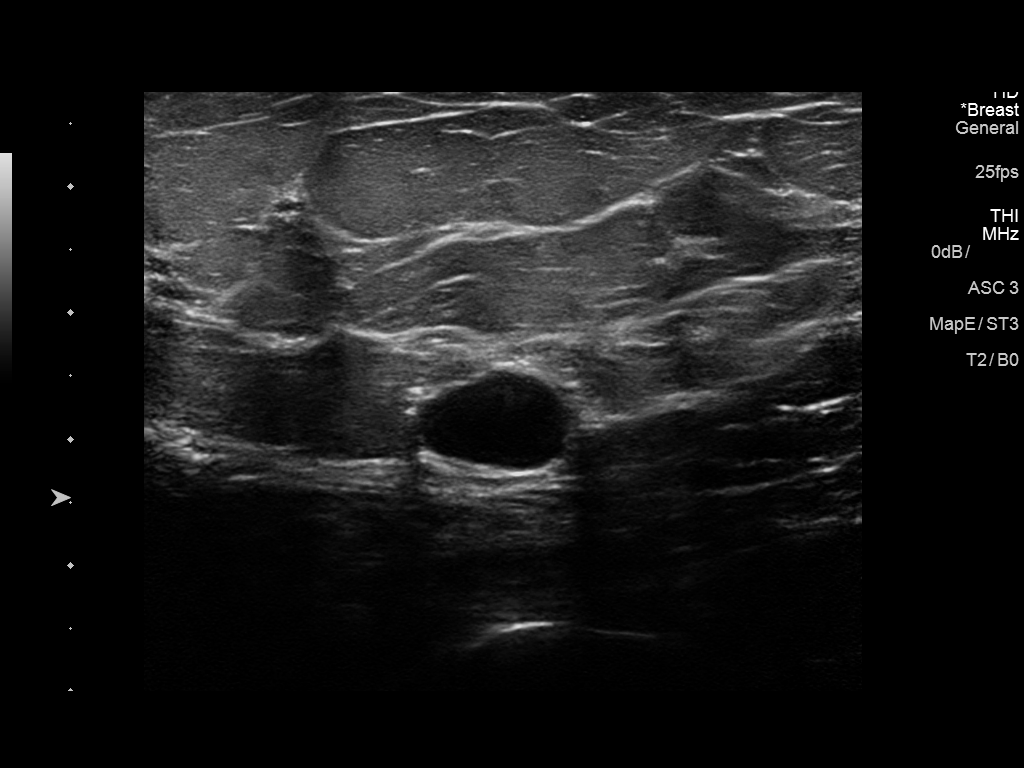
[im 6/7]
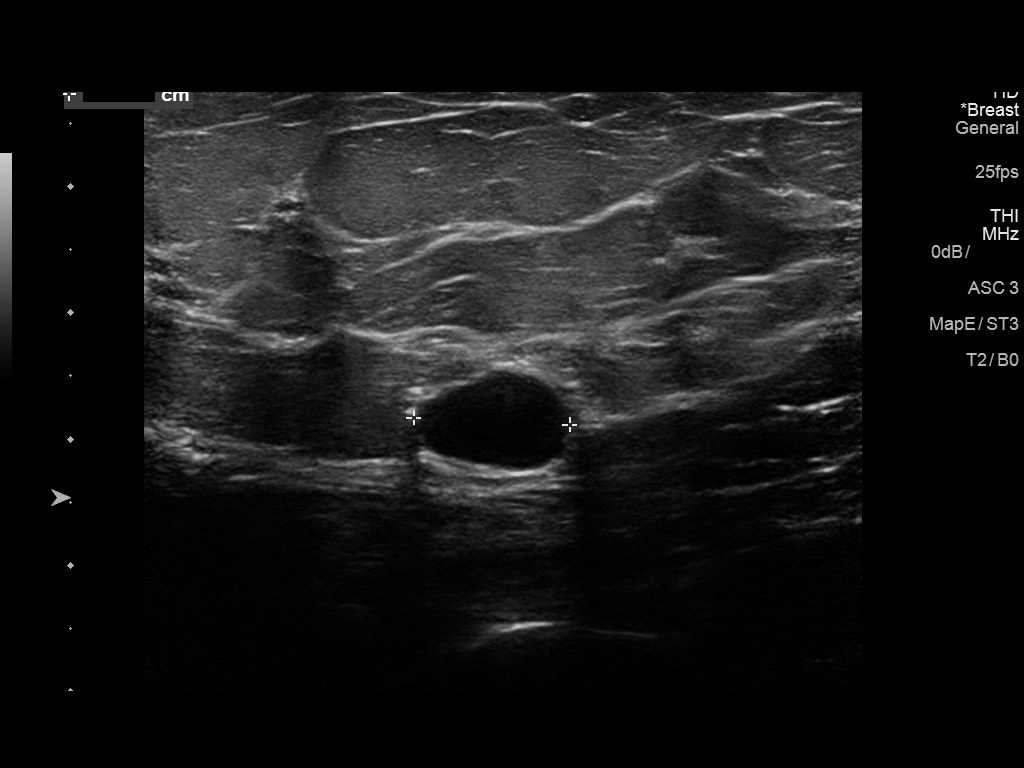
[im 7/7]
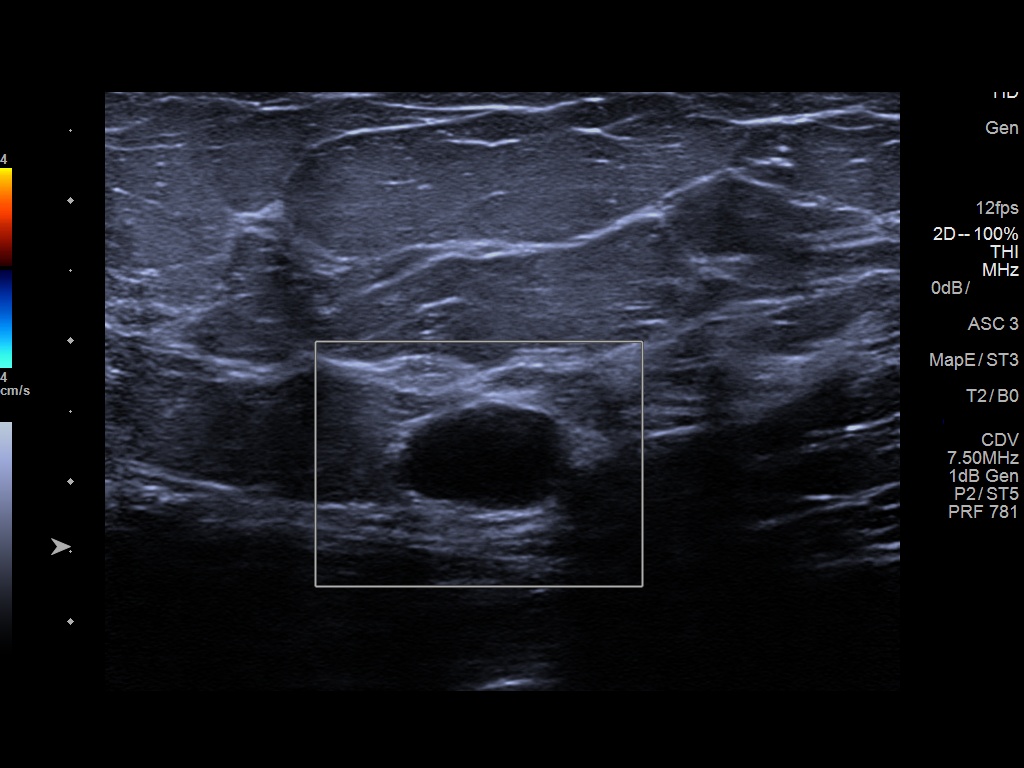

[7 of 7 positions shown; findings below may reference images not displayed]

ACR Breast Density Category b: There are scattered areas of
fibroglandular density.
FINDINGS: 2D and 3D spot compression views of the right breast demonstrate a
circumscribed oval mass within the inner right breast.

Targeted ultrasound is performed, showing a 1.4 x 0.8 x 1.2 cm
benign simple cyst at the [DATE] position of the right breast 4 cm
from the nipple, corresponding to the screening study finding.
IMPRESSION: Benign cyst in the inner right breast corresponding to the screening
study finding.

RECOMMENDATION:
Bilateral screening mammograms in 1 year.

I have discussed the findings and recommendations with the patient.
Results were also provided in writing at the conclusion of the
visit. If applicable, a reminder letter will be sent to the patient
regarding the next appointment.

BI-RADS CATEGORY  2: Benign.
